# Patient Record
Sex: Female | Born: 1990 | Race: White | Hispanic: No | Marital: Single | Smoking: Current every day smoker
Health system: Southern US, Community
[De-identification: ages and names within clinical notes are randomized; demographics above are authoritative.]

## PROBLEM LIST (undated history)

## (undated) DIAGNOSIS — N83209 Unspecified ovarian cyst, unspecified side: Secondary | ICD-10-CM

## (undated) DIAGNOSIS — K589 Irritable bowel syndrome without diarrhea: Secondary | ICD-10-CM

## (undated) HISTORY — PX: NO PAST SURGERIES: SHX2092

---

## 2017-03-01 ENCOUNTER — Other Ambulatory Visit: Payer: Self-pay

## 2017-03-01 ENCOUNTER — Encounter (HOSPITAL_COMMUNITY): Payer: Self-pay | Admitting: Emergency Medicine

## 2017-03-01 ENCOUNTER — Emergency Department (HOSPITAL_COMMUNITY): Payer: 59

## 2017-03-01 DIAGNOSIS — R1031 Right lower quadrant pain: Secondary | ICD-10-CM | POA: Diagnosis not present

## 2017-03-01 DIAGNOSIS — F17210 Nicotine dependence, cigarettes, uncomplicated: Secondary | ICD-10-CM | POA: Insufficient documentation

## 2017-03-01 LAB — CBC WITH DIFFERENTIAL/PLATELET
Basophils Absolute: 0.1 10*3/uL (ref 0.0–0.1)
Basophils Relative: 1 %
Eosinophils Absolute: 0 10*3/uL (ref 0.0–0.7)
Eosinophils Relative: 0 %
HCT: 37.6 % (ref 36.0–46.0)
Hemoglobin: 13.1 g/dL (ref 12.0–15.0)
Lymphocytes Relative: 19 %
Lymphs Abs: 1.7 10*3/uL (ref 0.7–4.0)
MCH: 32.4 pg (ref 26.0–34.0)
MCHC: 34.8 g/dL (ref 30.0–36.0)
MCV: 93.1 fL (ref 78.0–100.0)
Monocytes Absolute: 0.6 10*3/uL (ref 0.1–1.0)
Monocytes Relative: 6 %
Neutro Abs: 6.7 10*3/uL (ref 1.7–7.7)
Neutrophils Relative %: 74 %
Platelets: 326 10*3/uL (ref 150–400)
RBC: 4.04 MIL/uL (ref 3.87–5.11)
RDW: 12.3 % (ref 11.5–15.5)
WBC: 9.1 10*3/uL (ref 4.0–10.5)

## 2017-03-01 LAB — URINALYSIS, ROUTINE W REFLEX MICROSCOPIC
Bacteria, UA: NONE SEEN
Bilirubin Urine: NEGATIVE
Glucose, UA: NEGATIVE mg/dL
Ketones, ur: NEGATIVE mg/dL
Leukocytes, UA: NEGATIVE
Nitrite: NEGATIVE
Protein, ur: NEGATIVE mg/dL
Specific Gravity, Urine: 1.002 — ABNORMAL LOW (ref 1.005–1.030)
pH: 6 (ref 5.0–8.0)

## 2017-03-01 LAB — COMPREHENSIVE METABOLIC PANEL
ALT: 10 U/L — ABNORMAL LOW (ref 14–54)
AST: 18 U/L (ref 15–41)
Albumin: 4.4 g/dL (ref 3.5–5.0)
Alkaline Phosphatase: 51 U/L (ref 38–126)
Anion gap: 10 (ref 5–15)
BUN: 5 mg/dL — ABNORMAL LOW (ref 6–20)
CO2: 21 mmol/L — ABNORMAL LOW (ref 22–32)
Calcium: 9.4 mg/dL (ref 8.9–10.3)
Chloride: 106 mmol/L (ref 101–111)
Creatinine, Ser: 0.8 mg/dL (ref 0.44–1.00)
GFR calc Af Amer: >60 mL/min (ref >60)
GFR calc non Af Amer: >60 mL/min (ref >60)
Glucose, Bld: 110 mg/dL — ABNORMAL HIGH (ref 65–99)
Potassium: 3.5 mmol/L (ref 3.5–5.1)
Sodium: 137 mmol/L (ref 135–145)
Total Bilirubin: 0.9 mg/dL (ref 0.3–1.2)
Total Protein: 7.8 g/dL (ref 6.5–8.1)

## 2017-03-01 LAB — I-STAT BETA HCG BLOOD, ED (MC, WL, AP ONLY): I-stat hCG, quantitative: <5 m[IU]/mL (ref <5)

## 2017-03-01 LAB — LIPASE, BLOOD: Lipase: 27 U/L (ref 11–51)

## 2017-03-01 MED ORDER — OXYCODONE-ACETAMINOPHEN 5-325 MG PO TABS
1.0000 | ORAL_TABLET | Freq: Once | ORAL | Status: AC
  Administered 2017-03-01: 1 via ORAL
  Filled 2017-03-01: qty 1

## 2017-03-01 NOTE — ED Provider Notes (Signed)
Patient placed in Quick Look pathway, seen and evaluated   Chief Complaint: RLQ abdominal pain  HPI: Patient with 5-day history of acute onset, previously worsening sharp right lower quadrant pain with radiation to the back and buttocks.  Low back pain has worsened.  States this feels like her usual ovarian cyst rupture pain but worse.  Endorses subjective fevers and chills.  Denies nausea, vomiting, constipation, diarrhea, or urinary symptoms.  No bowel or bladder incontinence, no saddle anesthesia.  No history of IV drug use.  ROS: +ve for abdominal pain, fevers, chills, back pain -ve for nausea, vomiting, diarrhea, constipation, urinary symptoms, weakness.   Physical Exam:   Gen: No distress  Neuro: Awake and Alert  Skin: Warm    Focused Exam: Maximally tender in the right lower quadrant with some right upper quadrant and suprapubic tenderness.  Rebound present.  Psoas sign present.  Murphy sign and Rovsing's absent.  No CVA tenderness.  No midline lumbar spine tenderness but right paraspinal muscle tenderness and SI joint tenderness.  No deformity, crepitus, or step-off noted.   Initiation of care has begun. The patient has been counseled on the process, plan, and necessity for staying for the completion/evaluation, and the remainder of the medical screening examination    Bennye AlmFawze, Margarethe Virgen A, PA-C 03/01/17 2055    Margarita Grizzleay, Danielle, MD 03/05/17 [phone]

## 2017-03-02 ENCOUNTER — Emergency Department (HOSPITAL_COMMUNITY): Payer: 59

## 2017-03-02 ENCOUNTER — Encounter: Payer: Self-pay | Admitting: Physician Assistant

## 2017-03-02 ENCOUNTER — Emergency Department (HOSPITAL_COMMUNITY)
Admission: EM | Admit: 2017-03-02 | Discharge: 2017-03-02 | Disposition: A | Discharge status: Home / Self Care | Payer: 59 | Attending: Emergency Medicine | Admitting: Emergency Medicine

## 2017-03-02 DIAGNOSIS — R1031 Right lower quadrant pain: Secondary | ICD-10-CM

## 2017-03-02 HISTORY — DX: Unspecified ovarian cyst, unspecified side: N83.209

## 2017-03-02 HISTORY — DX: Irritable bowel syndrome without diarrhea: K58.9

## 2017-03-02 MED ORDER — MORPHINE SULFATE (PF) 4 MG/ML IV SOLN
4.0000 mg | Freq: Once | INTRAVENOUS | Status: AC
  Administered 2017-03-02: 4 mg via INTRAVENOUS
  Filled 2017-03-02: qty 1

## 2017-03-02 MED ORDER — DICYCLOMINE HCL 20 MG PO TABS: 20.0000 mg | ORAL_TABLET | Freq: Three times a day (TID) | ORAL | 0 refills | Status: AC

## 2017-03-02 MED ORDER — ONDANSETRON 4 MG PO TBDP: 4.0000 mg | ORAL_TABLET | Freq: Three times a day (TID) | ORAL | 0 refills | Status: AC | PRN

## 2017-03-02 MED ORDER — ONDANSETRON HCL 4 MG/2ML IJ SOLN
4.0000 mg | Freq: Once | INTRAMUSCULAR | Status: AC
  Administered 2017-03-02: 4 mg via INTRAVENOUS
  Filled 2017-03-02: qty 2

## 2017-03-02 MED ORDER — SODIUM CHLORIDE 0.9 % IV BOLUS (SEPSIS)
1000.0000 mL | Freq: Once | INTRAVENOUS | Status: AC
  Administered 2017-03-02: 1000 mL via INTRAVENOUS

## 2017-03-02 MED ORDER — PROBIOTIC PO CAPS: 1.0000 | ORAL_CAPSULE | Freq: Every day | ORAL | 1 refills | Status: AC

## 2017-03-02 MED ORDER — IOPAMIDOL (ISOVUE-300) INJECTION 61%
INTRAVENOUS | Status: AC
  Filled 2017-03-02: qty 30

## 2017-03-02 MED ORDER — IOPAMIDOL (ISOVUE-300) INJECTION 61%
INTRAVENOUS | Status: AC
  Administered 2017-03-02: 100 mL
  Filled 2017-03-02: qty 100

## 2017-03-02 NOTE — Discharge Instructions (Signed)
You may alternate Tylenol 1000 mg every 6 hours as needed for pain and Ibuprofen 800 mg every 8 hours as needed for pain.  Please take Ibuprofen with food. ° °

## 2017-03-02 NOTE — ED Provider Notes (Addendum)
TIME SEEN: 1:29 AM  CHIEF COMPLAINT: Right lower quadrant abdominal pain  HPI: Patient is a 27 year old female with history of IBS, ovarian cyst who presents to the emergency department with complaints of right lower quadrant abdominal pain.  Describes it as sharp and severe in nature that started 5 days ago.  Feels like her previous ovarian cyst.  Had vaginal bleeding that started as well.  Her last menstrual period was January 15.  Has had subjective fevers, nausea, vomiting.  No diarrhea.  No vaginal discharge.  No dysuria, hematuria, urinary frequency or urgency.  No previous abdominal surgeries.  No aggravating or alleviating factors.  ROS: See HPI Constitutional: Subjective fever  Eyes: no drainage  ENT: no runny nose   Cardiovascular:  no chest pain  Resp: no SOB  GI: Nausea and vomiting GU: no dysuria Integumentary: no rash  Allergy: no hives  Musculoskeletal: no leg swelling  Neurological: no slurred speech ROS otherwise negative  PAST MEDICAL HISTORY/PAST SURGICAL HISTORY:  Past Medical History:  Diagnosis Date  . Irritable bowel syndrome (IBS)   . Ovarian cyst    reports ruptured in past    MEDICATIONS:  Prior to Admission medications   Not on File    ALLERGIES:  No Known Allergies  SOCIAL HISTORY:  Social History   Tobacco Use  . Smoking status: Current Every Day Smoker    Packs/day: 0.30    Types: Cigarettes  . Smokeless tobacco: Never Used  Substance Use Topics  . Alcohol use: Yes    Comment: rare    FAMILY HISTORY: No family history on file.  EXAM: BP 108/64 (BP Location: Right Arm)   Pulse 61   Temp 98.2 F (36.8 C) (Oral)   Resp 18   Ht 5\' 5"  (1.651 m)   Wt 53.5 kg (118 lb)   LMP 02/01/2017 (Exact Date)   SpO2 100%   BMI 19.64 kg/m  CONSTITUTIONAL: Alert and oriented and responds appropriately to questions. Well-appearing; well-nourished HEAD: Normocephalic EYES: Conjunctivae clear, pupils appear equal, EOMI ENT: normal nose; moist  mucous membranes NECK: Supple, no meningismus, no nuchal rigidity, no LAD  CARD: RRR; S1 and S2 appreciated; no murmurs, no clicks, no rubs, no gallops RESP: Normal chest excursion without splinting or tachypnea; breath sounds clear and equal bilaterally; no wheezes, no rhonchi, no rales, no hypoxia or respiratory distress, speaking full sentences ABD/GI: Normal bowel sounds; non-distended; soft, tender in the right lower quadrant at McBurney's point, no rebound, no guarding, no peritoneal signs, no hepatosplenomegaly BACK:  The back appears normal and is non-tender to palpation, there is no CVA tenderness EXT: Normal ROM in all joints; non-tender to palpation; no edema; normal capillary refill; no cyanosis, no calf tenderness or swelling    SKIN: Normal color for age and race; warm; no rash NEURO: Moves all extremities equally PSYCH: The patient's mood and manner are appropriate. Grooming and personal hygiene are appropriate.  MEDICAL DECISION MAKING: Patient here with complaints of right lower quadrant pain.  Labs obtained in triage are unremarkable.  Urine shows no sign of infection.  Pregnancy test is negative.  Transvaginal ultrasound with Doppler shows normal right appearing ovary with good blood supply.  She does have a follicular cyst in the left ovary that is less than 3 cm.  There is no torsion, ectopic pregnancy, TOA.  I feel she will need a CT of her abdomen pelvis to evaluate for appendicitis, possible kidney stone.  Could also be her IBS.  Will give  IV fluids, pain and nausea medicine.  Patient is comfortable with this plan.  She will be kept n.p.o. at this time.  ED PROGRESS: CT scan shows no acute abnormality.  Normal-appearing appendix.  Normal-appearing kidneys.  Will give outpatient GI follow-up information.  She states she is feeling better.  Recommend a bland diet over the next several days.  Will discharge with probiotics, Bentyl and Zofran.  Patient is comfortable with this  plan.  Grandmother now reports that she has had intermittent symptoms for several months.  I feel GI follow-up is appropriate given negative workup here.   At this time, I do not feel there is any life-threatening condition present. I have reviewed and discussed all results (EKG, imaging, lab, urine as appropriate) and exam findings with patient/family. I have reviewed nursing notes and appropriate previous records.  I feel the patient is safe to be discharged home without further emergent workup and can continue workup as an outpatient as needed. Discussed usual and customary return precautions. Patient/family verbalize understanding and are comfortable with this plan.  Outpatient follow-up has been provided if needed. All questions have been answered.      Elai Vanwyk, Layla Maw, DO 03/02/17 0500    Alexandria Shiflett, Layla Maw, DO 03/02/17 [phone]

## 2017-03-02 NOTE — ED Notes (Signed)
Patient currently at CT scan .  

## 2017-03-17 ENCOUNTER — Other Ambulatory Visit: Payer: Self-pay | Admitting: Physician Assistant

## 2017-03-17 ENCOUNTER — Ambulatory Visit: Payer: 59 | Admitting: Physician Assistant

## 2017-03-17 ENCOUNTER — Ambulatory Visit (INDEPENDENT_AMBULATORY_CARE_PROVIDER_SITE_OTHER)
Admission: RE | Admit: 2017-03-17 | Discharge: 2017-03-17 | Disposition: A | Discharge status: Home / Self Care | Payer: 59 | Source: Ambulatory Visit | Attending: Physician Assistant | Admitting: Physician Assistant

## 2017-03-17 ENCOUNTER — Encounter: Payer: Self-pay | Admitting: Physician Assistant

## 2017-03-17 ENCOUNTER — Other Ambulatory Visit (INDEPENDENT_AMBULATORY_CARE_PROVIDER_SITE_OTHER): Payer: 59

## 2017-03-17 VITALS — BP 102/58 | HR 100 | Ht 64.5 in | Wt 111.1 lb

## 2017-03-17 DIAGNOSIS — M79651 Pain in right thigh: Secondary | ICD-10-CM

## 2017-03-17 DIAGNOSIS — R110 Nausea: Secondary | ICD-10-CM

## 2017-03-17 DIAGNOSIS — M7918 Myalgia, other site: Secondary | ICD-10-CM

## 2017-03-17 DIAGNOSIS — R1031 Right lower quadrant pain: Secondary | ICD-10-CM

## 2017-03-17 DIAGNOSIS — M25551 Pain in right hip: Secondary | ICD-10-CM

## 2017-03-17 LAB — SEDIMENTATION RATE: SED RATE: 18 mm/h (ref 0–20)

## 2017-03-17 LAB — HIGH SENSITIVITY CRP: CRP HIGH SENSITIVITY: 3.75 mg/L (ref 0.000–5.000)

## 2017-03-17 MED ORDER — PROMETHAZINE HCL 25 MG PO TABS: ORAL_TABLET | ORAL | 0 refills | Status: AC

## 2017-03-17 NOTE — Patient Instructions (Addendum)
Please go to the basement level to our lab and X-Ray department.   [facility] Phenergan 25 mg.  Try Tylenol regular strength, take 2 tablets by mouth every 8 hours for pain.   We will call [facility] with [facility].   If you are age 25 or younger, your body mass index should be between 19-25. Your Body mass index is 18.78 kg/m. If this is out of the aformentioned range listed, please consider follow up with your Primary Care Provider.

## 2017-03-17 NOTE — Progress Notes (Signed)
Subjective:    Patient ID: Heidi Scott, female    DOB: 16-Oct-1990, 27 y.o.   MRN: [id number]  HPI Heidi Scott is a pleasant 27 year old white female, who to GI today he was referred by the ER after recent visit on 03/02/2017. She does not have a PCP. She is currently living in [place]. Patient says she onset of her current symptoms around August 2018 and that she has had ongoing pain ever since. She describes a constant pain in her right lower quadrant, also into the right hip and right buttocks and at times will have pain that radiates across the anterior right thigh. She said symptoms are eased off with sitting or lying down and made worse by being up and moving around. She has had no association of any of her symptoms to by mouth intake. She has had some mild constipation but cannot relate any improvement or change in symptoms with bowel movements. There's been no melena or hematochezia. No fever or chills. She says when the pain is bad she will have nausea but has not been vomiting. Upper weight is down about 7 pounds since August. She has been taking ibuprofen fairly regularly but not sure is helping. She says her pain is always much worse as is the nausea with her menstrual period  ER visit on 03/02/2017 with labs including CBC chemistries lipase and UA all unremarkable. CT of the abdomen and pelvis was done which was negative, there was a normal appendix, she was noted to have a trace free fluid in the pelvis felt to be physiologic. Patient relates having prior endoscopy many years ago and had a colonoscopy perhaps 3 or 4 years ago when she was living in [place]. She says that was negative and she was diagnosed with IBS.  Review of Systems Pertinent positive and negative review of systems were noted in the above HPI section.  All other review of systems was otherwise negative.  Outpatient Encounter Medications as of 03/17/2017  Medication Sig  . dicyclomine (BENTYL) 20 MG tablet Take 1 tablet  (20 mg total) by mouth 3 (three) times daily before meals. As needed for abdominal cramps  . Norethindrone-Ethinyl Estradiol-Fe Biphas (LO LOESTRIN FE) 1 MG-10 MCG / 10 MCG tablet Take 1 tablet by mouth daily.  . ondansetron (ZOFRAN ODT) 4 MG disintegrating tablet Take 1 tablet (4 mg total) by mouth every 8 (eight) hours as needed for nausea or vomiting.  . Probiotic CAPS Take 1 capsule by mouth daily.  . promethazine (PHENERGAN) 25 MG tablet Take 1/2 tablet by mouth every 6-8 hours as needed for nausea.   No facility-administered encounter medications on file as of 03/17/2017.    No Known Allergies There are no active problems to display for this patient.  Social History   Socioeconomic History  . Marital status: Single    Spouse name: Not on file  . Number of children: 0  . Years of education: Not on file  . Highest education level: Not on file  Social Needs  . Financial resource strain: Not on file  . Food insecurity - worry: Not on file  . Food insecurity - inability: Not on file  . Transportation needs - medical: Not on file  . Transportation needs - non-medical: Not on file  Occupational History  . Occupation: waitress  Tobacco Use  . Smoking status: Current Every Day Smoker    Packs/day: 0.30    Types: Cigarettes  . Smokeless tobacco: Never Used  Substance and  Sexual Activity  . Alcohol use: Yes    Comment: rare  . Drug use: Yes    Types: Marijuana  . Sexual activity: Not on file  Other Topics Concern  . Not on file  Social History Narrative  . Not on file    Heidi Scott's family history includes COPD in her maternal grandmother; Celiac disease in her mother; Hyperthyroidism in her mother; Hypothyroidism in her maternal grandmother and sister; Lung cancer in her paternal grandmother; Lupus in her mother; Rheum arthritis in her mother.      Objective:    Vitals:   03/17/17 1401  BP: (!) 102/58  Pulse: 100    Physical Exam; well-developed young white female  in no acute distress, accompanied by her grandmother blood pressure 102/58 pulse 100, height 5 foot 4, weight 111, BMI 18.7. HEENT; nontraumatic normocephalic EOMI PERRLA sclera anicteric, Cardiovascular ;regular rate and rhythm with S1-S2 no murmur or gallop, Pulmonary; clear bilaterally, Abdomen ;soft, she is tender in the right lower quadrant and along the right iliac crest, also has some generalized lower abdominal tenderness bowel sounds are present, no palpable mass or hepatosplenomegaly. Exam ;of the back patient has point tenderness over the SI joint on the right. Rectal ;exam not done, Extremities; no clubbing cyanosis or edema skin warm and dry, Neuropsych; mood and affect appropriate       Assessment & Plan:   #36 27 year old female with about 8 month history of constant right-sided abdominal pain which was primarily right lower quadrant. She is also having pain into the right hip and buttock at times with radiation across the right anterior thigh. Pain has been associated with nausea and decrease in appetite. Patient states that her pain and nausea are consistently much worse around the time of her menstrual period Recent workup including CT of the abdomen and pelvis was unremarkable with no evidence of edema of the bowel or inflammatory process. She did have a trace amount of free fluid in the pelvis. I'm not convinced that her symptoms are of GI origin. I'm concerned that she may have endometriosis. At this time unclear whether the right hip and right back pain are musculoskeletal and separate or related to above.  #2 history of IBS #3 family history of celiac disease  Plan; Will check lumbosacral spine films, and right hip films Patient will be referred to gynecology I think GYN in bowel should be done initially including laparoscopy if needed. If GYN evaluation is completely negative, we are happy to see her back. Patient has prescription for Zofran which is not particularly helpful,  we will try Phenergan 25 mg, take one half tablet every 6 hours when necessary for nausea She asked for a refill on Bentyl which does help her IBS symptoms she would take this every 6 hours when necessary. Patient will be established with Dr. Adela Lank.  Zeniya Lapidus S Mathew Storck PA-C 03/17/2017   Cc: No ref. provider found

## 2017-03-18 LAB — ANA: ANA: NEGATIVE

## 2017-03-18 NOTE — Progress Notes (Signed)
Agree with assessment and plan as outlined.  

## 2019-03-16 IMAGING — CT CT ABD-PELV W/ CM
1 series · 4 of 32 positions shown, 5 images · IV contrast (iopamidol)
Comparison: Pelvic ultrasound March 01, 2017

CLINICAL DATA: Low back pain radiating to back for 5 days. History
of ruptured ovarian cyst, irritable bowel syndrome. Slight fever.

EXAM:
CT ABDOMEN AND PELVIS WITH CONTRAST
TECHNIQUE: Multidetector CT imaging of the abdomen and pelvis was performed
using the standard protocol following bolus administration of
intravenous contrast.
CONTRAST:  100mL OQU50K-3GG IOPAMIDOL (OQU50K-3GG) INJECTION 61%

[Series 1012: virtual unenhanced vnc mpr tra 2mm range (auto) · axial · 0.28mm/px · z∈[+680,+986]mm · 4 of 207 slices shown, 5 images]
[im 27/207  soft-tissue]
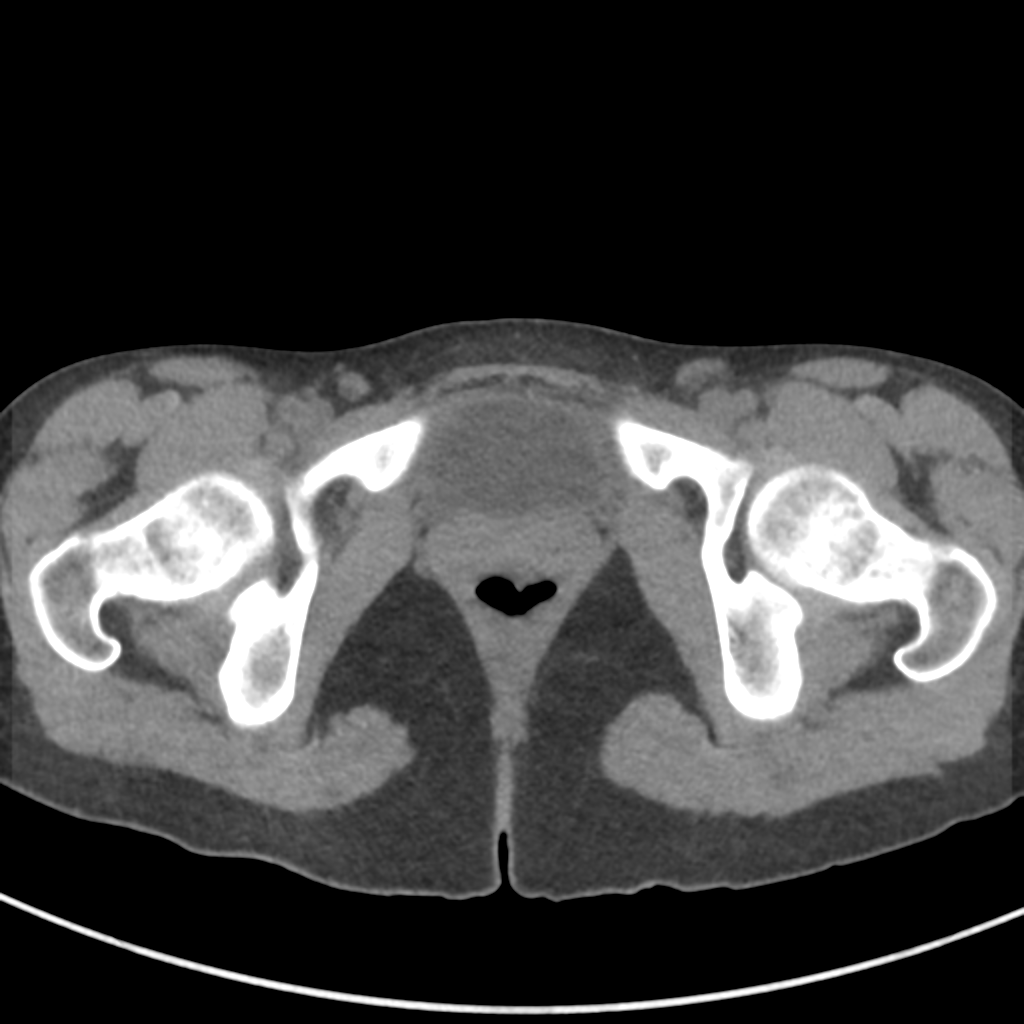
[im 27/207  bone]
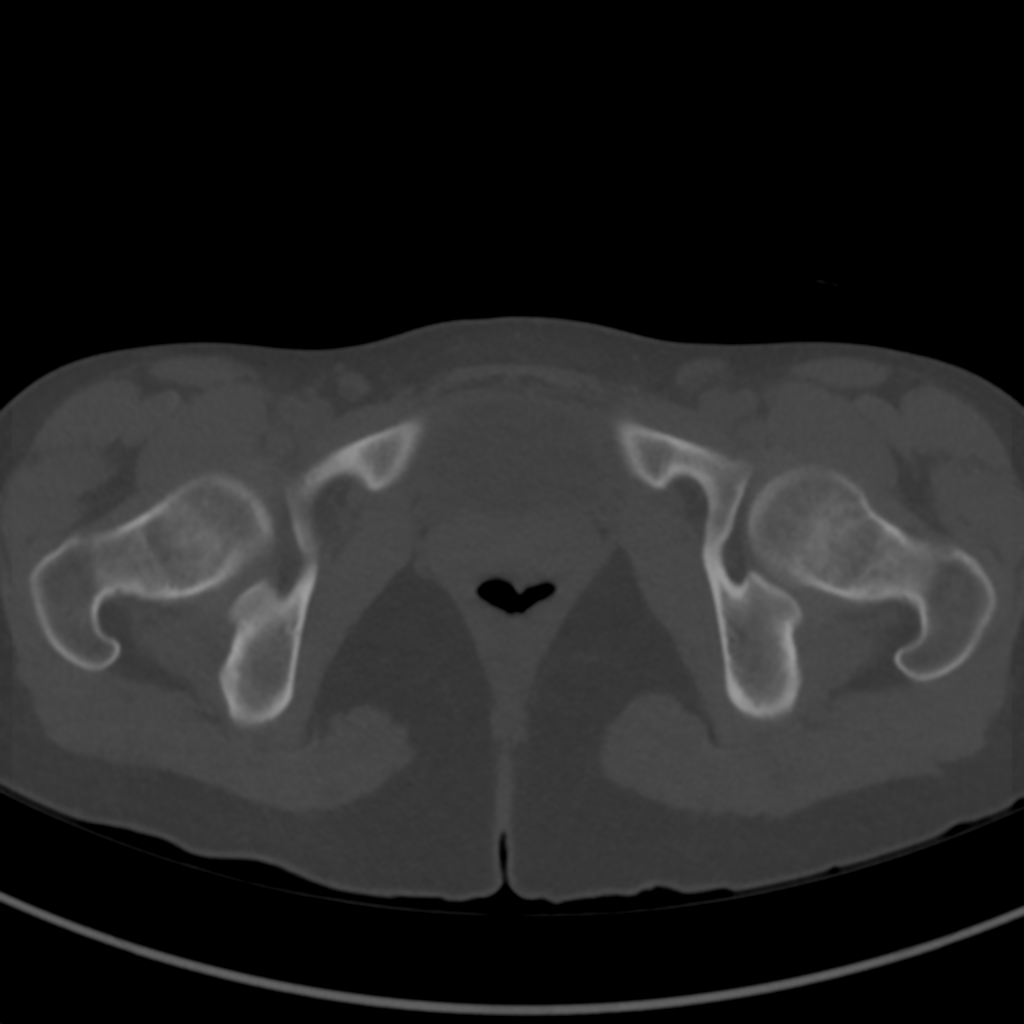
[im 80/207  soft-tissue]
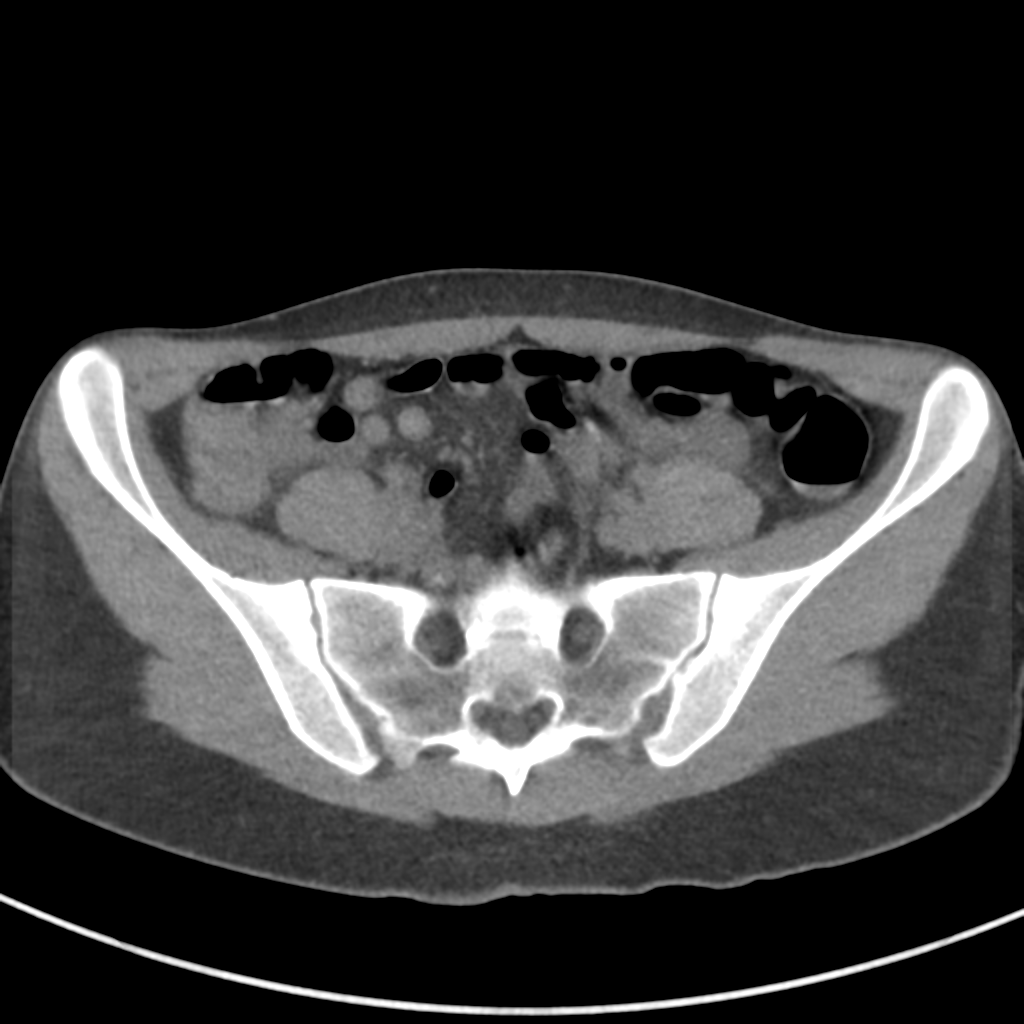
[im 127/207  soft-tissue]
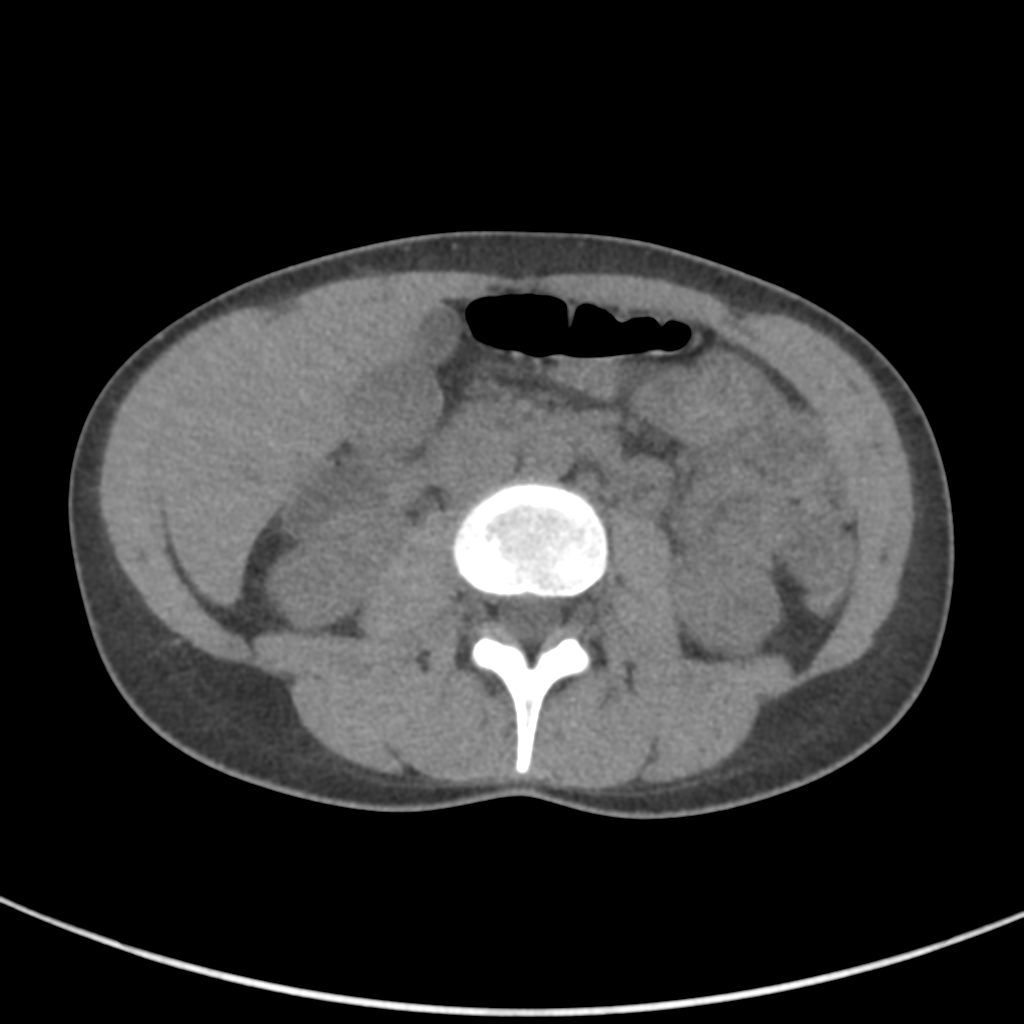
[im 180/207  soft-tissue]
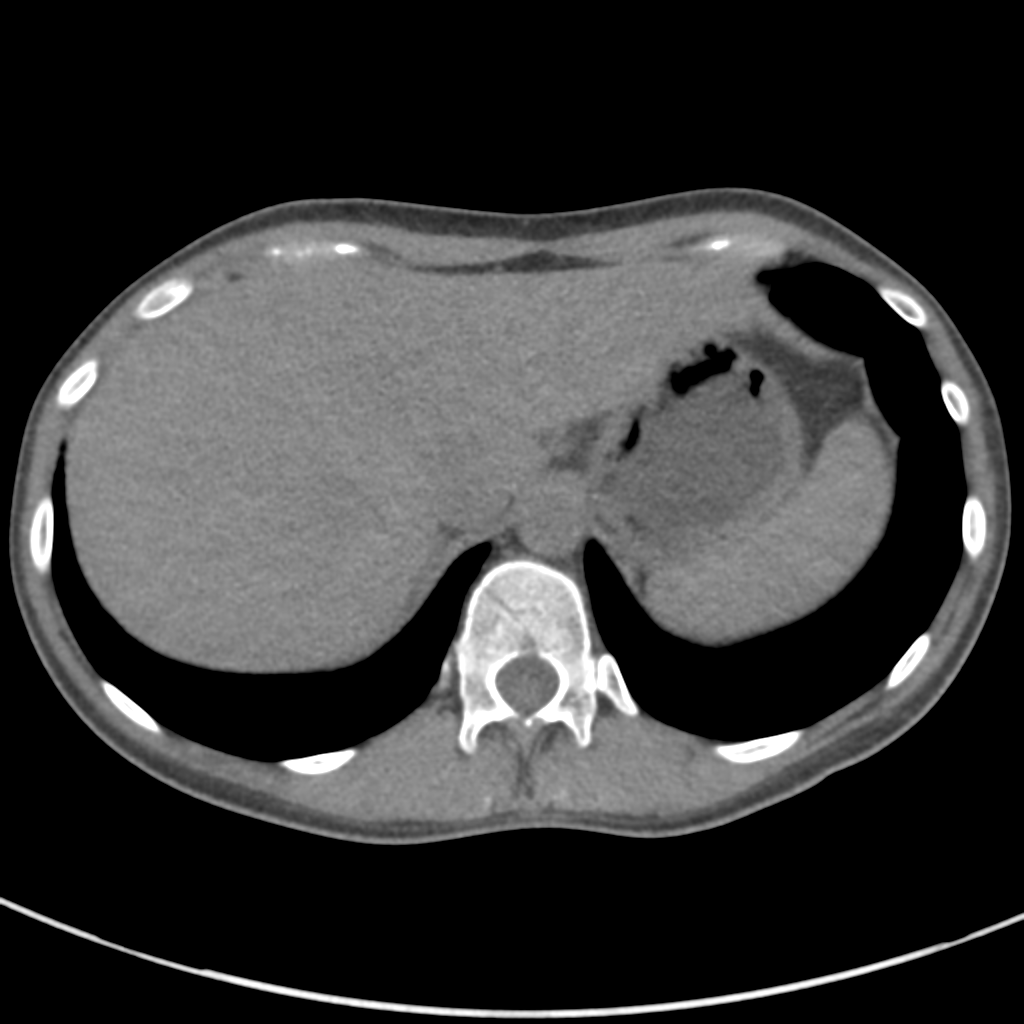

[4 of 32 positions shown; findings below may reference images not displayed]

FINDINGS: LOWER CHEST: Lung bases are clear. Included heart size is normal. No
pericardial effusion.

HEPATOBILIARY: Liver and gallbladder are normal. Mild focal fatty
infiltration about the falciform ligament.

PANCREAS: Normal.

SPLEEN: Normal.

ADRENALS/URINARY TRACT: Kidneys are orthotopic, demonstrating
symmetric enhancement. No nephrolithiasis, hydronephrosis or solid
renal masses. The unopacified ureters are normal in course and
caliber. Delayed imaging through the kidneys demonstrates symmetric
prompt contrast excretion within the proximal urinary collecting
system. Urinary bladder is partially distended and unremarkable.
Normal adrenal glands.

STOMACH/BOWEL: The stomach, small and large bowel are normal in
course and caliber without inflammatory changes. Normal appendix.

VASCULAR/LYMPHATIC: Aortoiliac vessels are normal in course and
caliber. No lymphadenopathy by CT size criteria.

REPRODUCTIVE: Normal.

OTHER: Trace free fluid in the pelvis is likely physiologic.

MUSCULOSKELETAL: Nonacute.  Mild broad levoscoliosis.
IMPRESSION: 1. No acute intra-abdominal or pelvic process.  Normal appendix.

## 2019-03-31 IMAGING — DX DG LUMBAR SPINE COMPLETE 4+V
5 series · 5 of 5 positions shown · non-contrast
Comparison: None.

CLINICAL DATA: Right hip and buttock pain radiating to the anterior
thigh for 7 months, no injury

EXAM:
LUMBAR SPINE - COMPLETE 4+ VIEW

[l-spine ap]
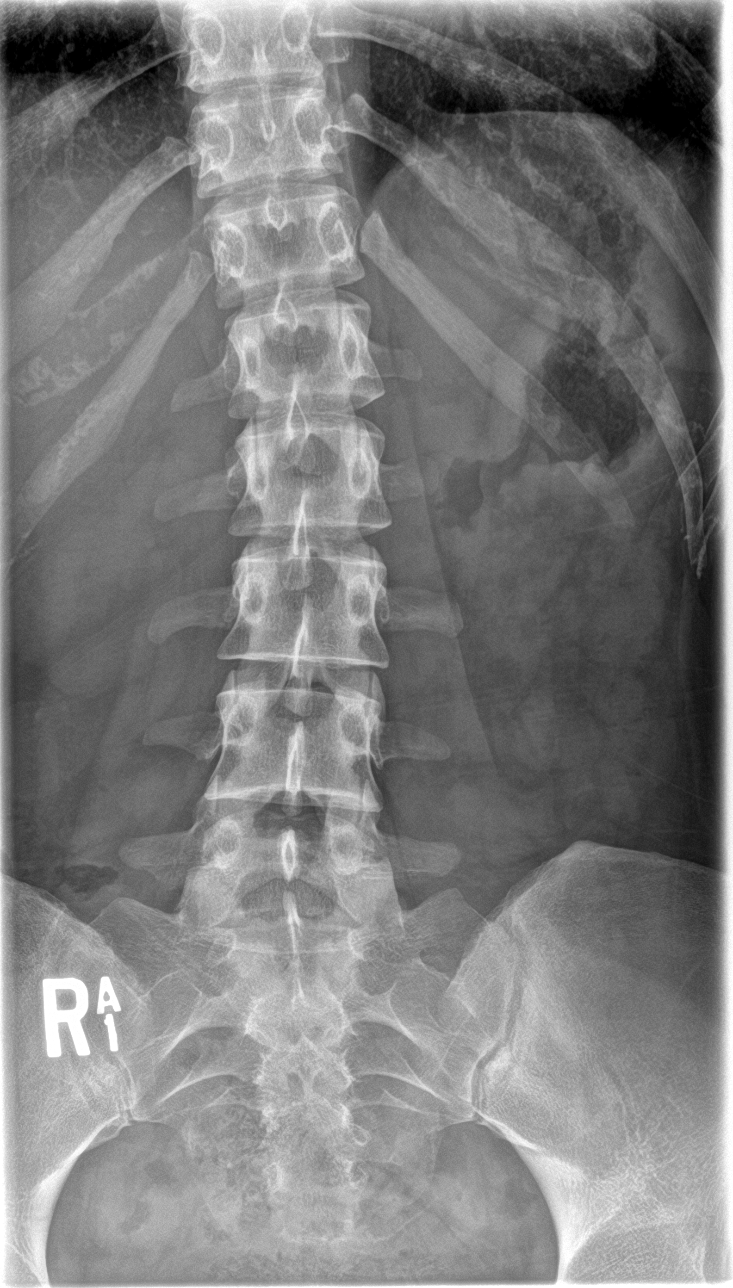

[l-spine obl (1 of 2)]
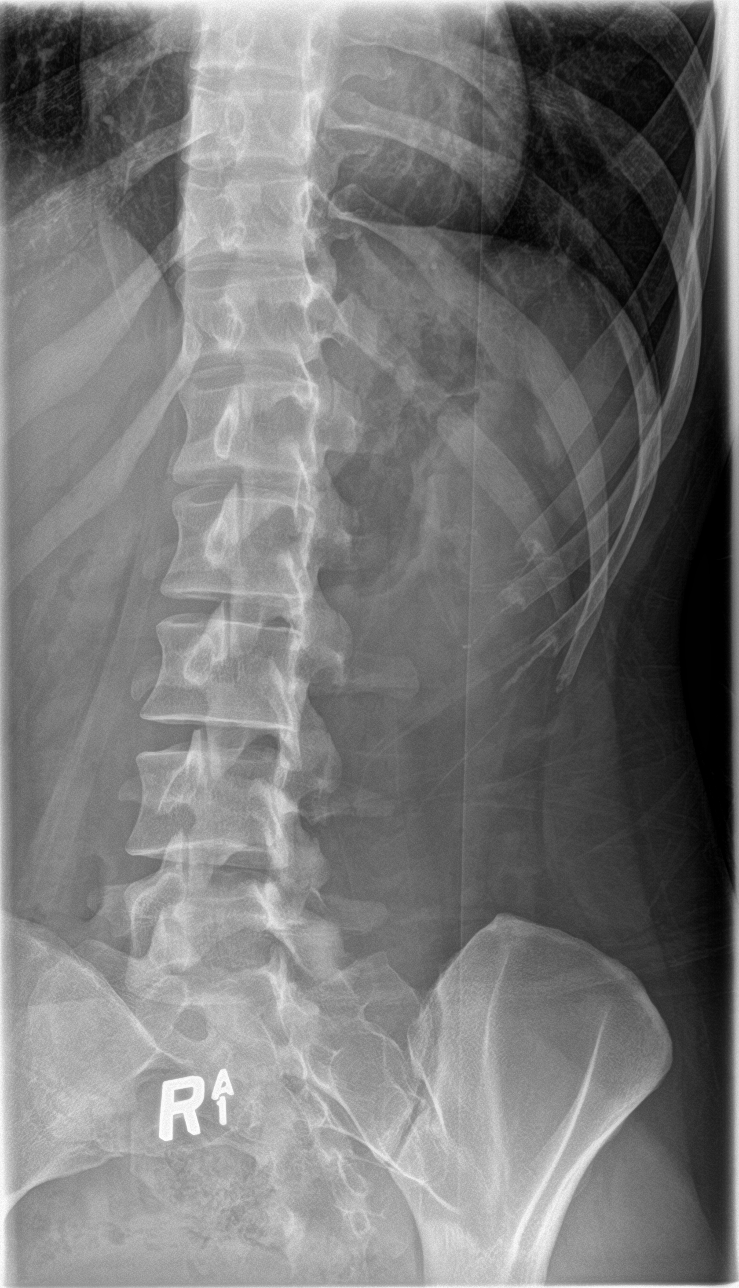

[l-spine obl (2 of 2)]
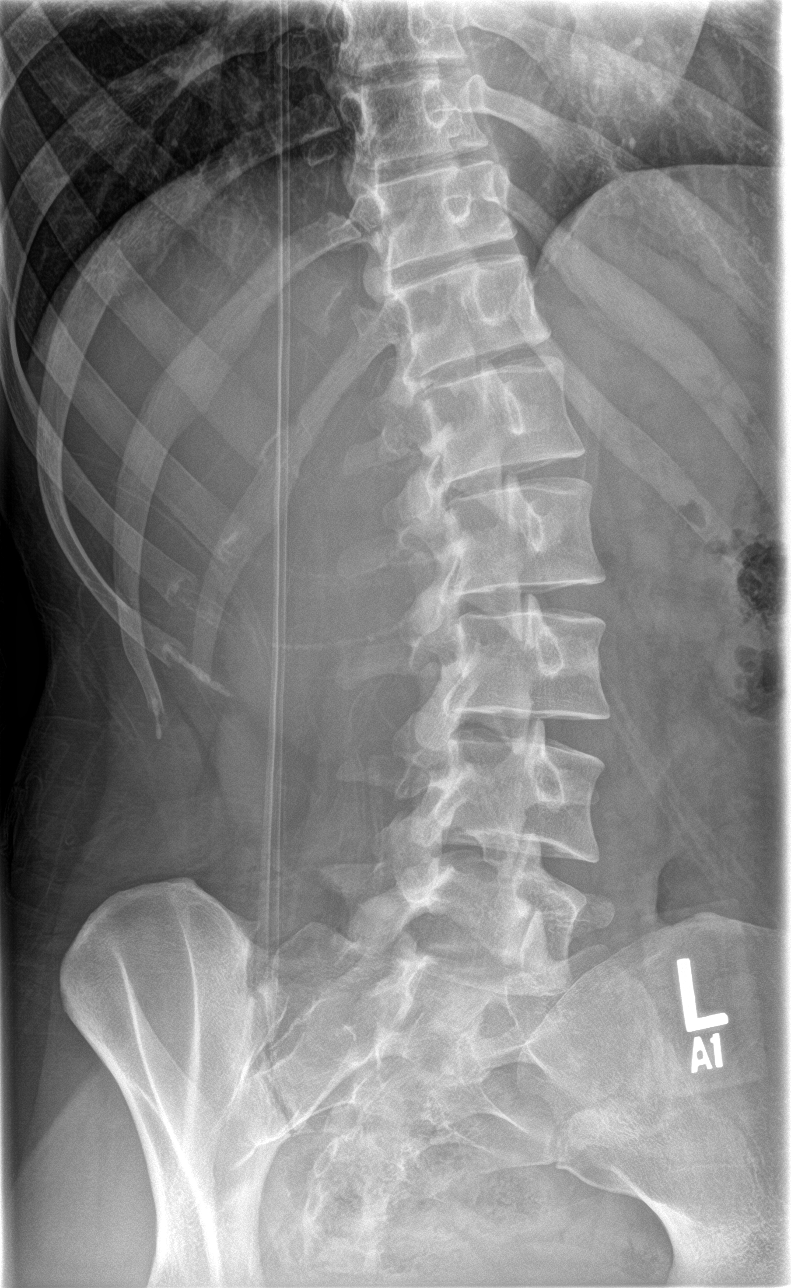

[l-spine lat]
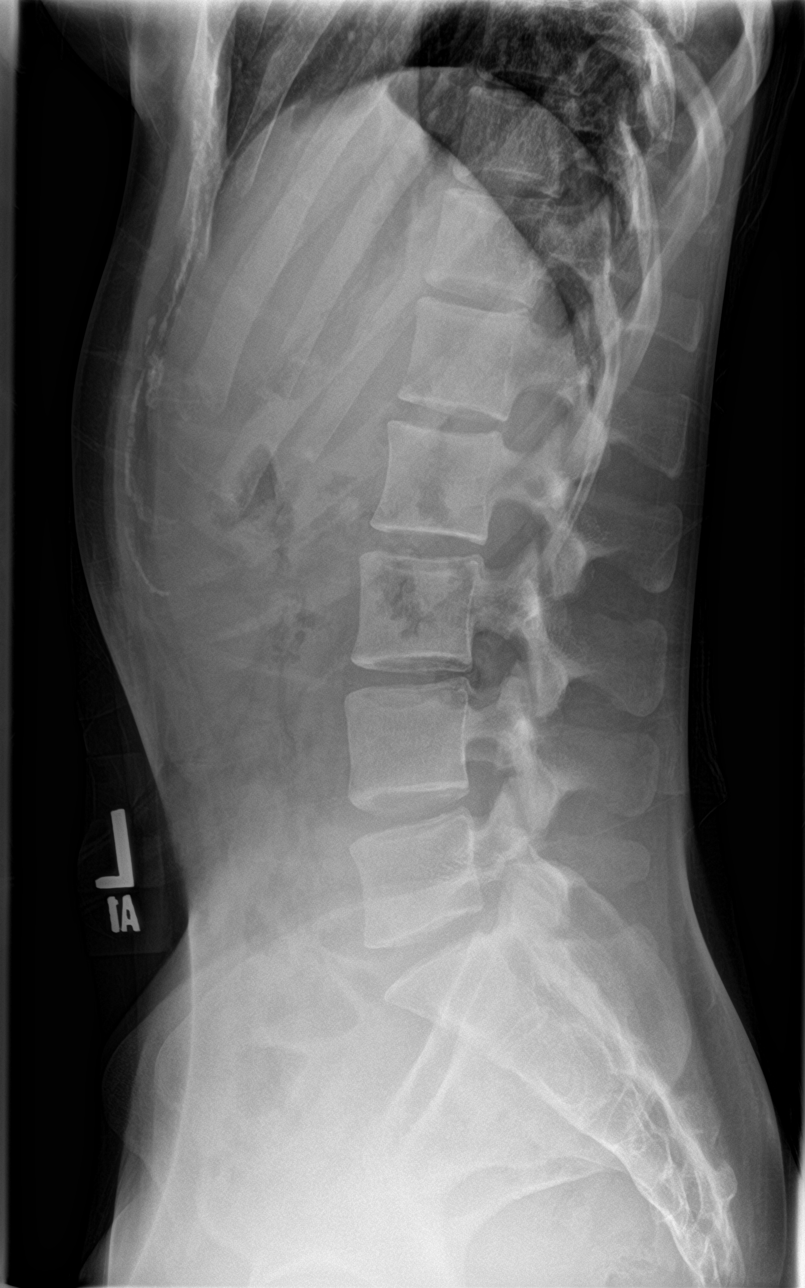

[l-spine spot]
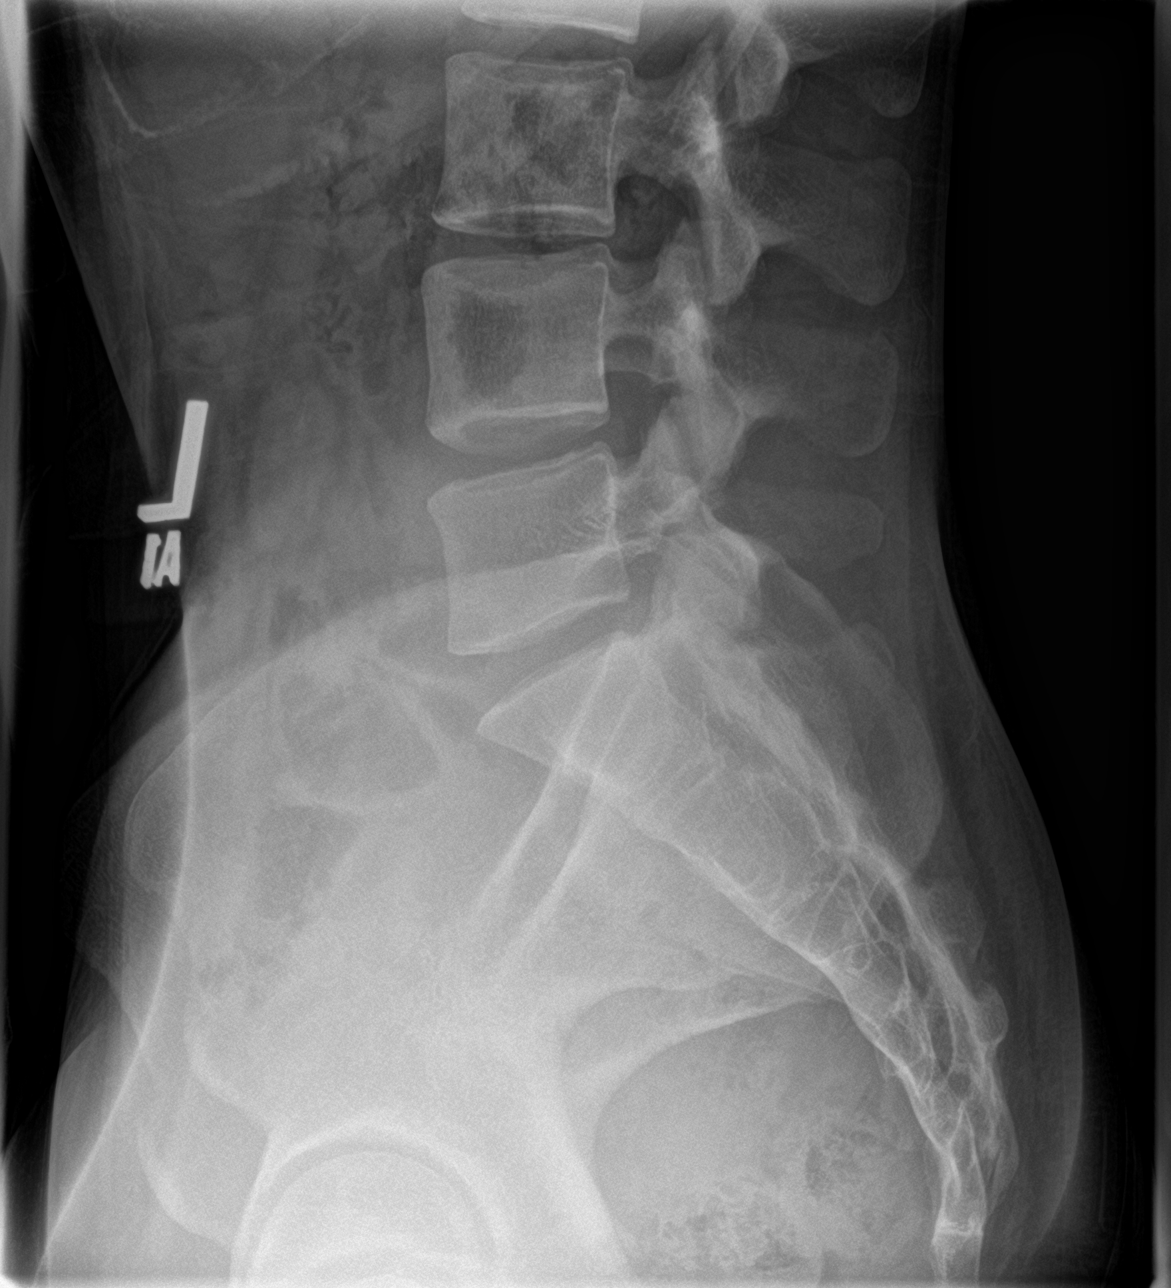

[5 of 5 positions shown; findings below may reference images not displayed]

FINDINGS: The lumbar vertebrae are in normal alignment. Intervertebral disc
spaces appear normal. No compression deformity is seen. On the
frontal view there is very slight curvature of the lumbar spine
convex to the left. The SI joints appear corticated. The bowel gas
pattern is nonspecific.
IMPRESSION: 1. Mild curvature of the lumbar spine convex the left.
2. Normal intervertebral disc spaces.  No acute abnormality.
